# Patient Record
Sex: Female | Born: 1975 | Race: White | Hispanic: No | Marital: Married | Smoking: Former smoker
Health system: Southern US, Community
[De-identification: ages and names within clinical notes are randomized; demographics above are authoritative.]

## PROBLEM LIST (undated history)

## (undated) DIAGNOSIS — K219 Gastro-esophageal reflux disease without esophagitis: Secondary | ICD-10-CM

## (undated) DIAGNOSIS — F419 Anxiety disorder, unspecified: Secondary | ICD-10-CM

## (undated) DIAGNOSIS — F32A Depression, unspecified: Secondary | ICD-10-CM

## (undated) DIAGNOSIS — F329 Major depressive disorder, single episode, unspecified: Secondary | ICD-10-CM

## (undated) HISTORY — DX: Gastro-esophageal reflux disease without esophagitis: K21.9

## (undated) HISTORY — DX: Anxiety disorder, unspecified: F41.9

## (undated) HISTORY — PX: TONSILLECTOMY: SHX5217

## (undated) HISTORY — DX: Depression, unspecified: F32.A

## (undated) HISTORY — DX: Major depressive disorder, single episode, unspecified: F32.9

## (undated) HISTORY — PX: TUBAL LIGATION: SHX77

---

## 2006-05-16 ENCOUNTER — Emergency Department: Payer: Self-pay | Admitting: Emergency Medicine

## 2010-03-26 ENCOUNTER — Emergency Department: Payer: Self-pay | Admitting: Emergency Medicine

## 2011-04-07 ENCOUNTER — Inpatient Hospital Stay: Payer: Self-pay | Admitting: Obstetrics and Gynecology

## 2013-11-11 ENCOUNTER — Emergency Department: Payer: Self-pay | Admitting: Emergency Medicine

## 2013-11-11 LAB — CBC
HCT: 40.7 % (ref 35.0–47.0)
HGB: 14.1 g/dL (ref 12.0–16.0)
MCH: 31.5 pg (ref 26.0–34.0)
MCHC: 34.7 g/dL (ref 32.0–36.0)
MCV: 91 fL (ref 80–100)
Platelet: 251 10*3/uL (ref 150–440)
RBC: 4.48 10*6/uL (ref 3.80–5.20)
RDW: 13.4 % (ref 11.5–14.5)
WBC: 6.4 10*3/uL (ref 3.6–11.0)

## 2013-11-11 LAB — BASIC METABOLIC PANEL
ANION GAP: 4 — AB (ref 7–16)
BUN: 15 mg/dL (ref 7–18)
CHLORIDE: 105 mmol/L (ref 98–107)
Calcium, Total: 9.3 mg/dL (ref 8.5–10.1)
Co2: 27 mmol/L (ref 21–32)
Creatinine: 0.68 mg/dL (ref 0.60–1.30)
EGFR (African American): >60
EGFR (Non-African Amer.): >60
Glucose: 88 mg/dL (ref 65–99)
OSMOLALITY: 272 (ref 275–301)
POTASSIUM: 4.1 mmol/L (ref 3.5–5.1)
Sodium: 136 mmol/L (ref 136–145)

## 2013-11-11 LAB — TROPONIN I: Troponin-I: <0.02 ng/mL

## 2015-05-31 ENCOUNTER — Other Ambulatory Visit: Payer: Self-pay | Admitting: Family Medicine

## 2015-06-01 NOTE — Telephone Encounter (Signed)
Routing to provider  

## 2015-09-15 ENCOUNTER — Other Ambulatory Visit: Payer: Self-pay | Admitting: Family Medicine

## 2015-09-15 NOTE — Telephone Encounter (Signed)
She is doing good with it. No complaints, would like to continue it.

## 2015-09-15 NOTE — Telephone Encounter (Signed)
Your patient.  Thanks 

## 2015-09-15 NOTE — Telephone Encounter (Signed)
Please just call patient, see how she's doing in terms of her mood; offer appt if stress or depression; otherwise, I'll be happy to refill until her next physical (due January 28, 2016 or just after)

## 2015-09-15 NOTE — Telephone Encounter (Signed)
Left message to call.

## 2016-03-07 ENCOUNTER — Other Ambulatory Visit: Payer: Self-pay

## 2016-03-07 MED ORDER — CITALOPRAM HYDROBROMIDE 40 MG PO TABS: 40.0000 mg | ORAL_TABLET | Freq: Every day | ORAL | Status: DC

## 2016-03-07 NOTE — Telephone Encounter (Signed)
Last seen by you for CPE 4/16

## 2016-03-07 NOTE — Telephone Encounter (Signed)
Great, thank you Rx sent with note asking her to schedule appt

## 2016-03-07 NOTE — Telephone Encounter (Signed)
When was patient last seen by me or other provider? I don't see any record of her in the chart, and there is no PCP listed Thank you

## 2016-04-25 ENCOUNTER — Other Ambulatory Visit: Payer: Self-pay

## 2016-04-26 ENCOUNTER — Other Ambulatory Visit: Payer: Self-pay

## 2016-04-26 NOTE — Telephone Encounter (Signed)
Patient has appointment coming up tomorrow.

## 2016-04-27 ENCOUNTER — Encounter: Payer: Self-pay | Admitting: Unknown Physician Specialty

## 2016-04-27 ENCOUNTER — Ambulatory Visit (INDEPENDENT_AMBULATORY_CARE_PROVIDER_SITE_OTHER): Payer: BLUE CROSS/BLUE SHIELD | Admitting: Unknown Physician Specialty

## 2016-04-27 ENCOUNTER — Telehealth: Payer: Self-pay | Admitting: Unknown Physician Specialty

## 2016-04-27 VITALS — BP 130/84 | HR 87 | Temp 99.1°F | Ht 65.3 in | Wt 275.8 lb

## 2016-04-27 DIAGNOSIS — Z23 Encounter for immunization: Secondary | ICD-10-CM

## 2016-04-27 DIAGNOSIS — M25471 Effusion, right ankle: Secondary | ICD-10-CM | POA: Diagnosis not present

## 2016-04-27 DIAGNOSIS — F32A Depression, unspecified: Secondary | ICD-10-CM | POA: Insufficient documentation

## 2016-04-27 DIAGNOSIS — Z0000 Encounter for general adult medical examination without abnormal findings: Secondary | ICD-10-CM | POA: Diagnosis not present

## 2016-04-27 DIAGNOSIS — L259 Unspecified contact dermatitis, unspecified cause: Secondary | ICD-10-CM

## 2016-04-27 DIAGNOSIS — F329 Major depressive disorder, single episode, unspecified: Secondary | ICD-10-CM | POA: Diagnosis not present

## 2016-04-27 DIAGNOSIS — R5383 Other fatigue: Secondary | ICD-10-CM | POA: Diagnosis not present

## 2016-04-27 DIAGNOSIS — E669 Obesity, unspecified: Secondary | ICD-10-CM | POA: Diagnosis not present

## 2016-04-27 MED ORDER — MOMETASONE FUROATE 0.1 % EX CREA: 1.0000 "application " | TOPICAL_CREAM | Freq: Every day | CUTANEOUS | 0 refills | Status: DC

## 2016-04-27 MED ORDER — CITALOPRAM HYDROBROMIDE 40 MG PO TABS: 40.0000 mg | ORAL_TABLET | Freq: Every day | ORAL | 3 refills | Status: DC

## 2016-04-27 MED ORDER — PREDNISONE 10 MG (21) PO TBPK: 10.0000 mg | ORAL_TABLET | Freq: Every day | ORAL | 0 refills | Status: DC

## 2016-04-27 MED ORDER — RANITIDINE HCL 150 MG PO TABS: 150.0000 mg | ORAL_TABLET | Freq: Two times a day (BID) | ORAL | 3 refills | Status: DC

## 2016-04-27 NOTE — Assessment & Plan Note (Signed)
Work on diet and exercise 

## 2016-04-27 NOTE — Telephone Encounter (Signed)
Pharmacy would like clarification on predniSONE (STERAPRED UNI-PAK 21 TAB) 10 MG (21) TBPK tablet.

## 2016-04-27 NOTE — Assessment & Plan Note (Signed)
Check [place] of right leg

## 2016-04-27 NOTE — Progress Notes (Signed)
 BP 130/84 (BP Location: Left Arm, Patient Position: Sitting, Cuff Size: Large)   Pulse 87   Temp 99.1 F (37.3 C)   Ht 5' 5.3" (1.659 m)   Wt 275 lb 12.8 oz (125.1 kg)   LMP 03/12/2016 (Approximate)   SpO2 97%   BMI 45.47 kg/m    Subjective:    Patient ID: Ellen Owens, female    DOB: 08/02/1976, 40 y.o.   MRN: [id number]  HPI: Ellen Owens is a 40 y.o. female  Chief Complaint  Patient presents with  . Annual Exam   Depression Pt has had issues with depression for a while.  She is taking daily Citalopram.  More stress lately as husband placed on the heart transplant list.   Depression screen PHQ 2/9 04/27/2016  Decreased Interest 2  Down, Depressed, Hopeless 3  PHQ - 2 Score 5  Altered sleeping 2  Tired, decreased energy 2  Change in appetite 3  Feeling bad or failure about yourself  0  Trouble concentrating 1  Moving slowly or fidgety/restless 0  Suicidal thoughts 0  PHQ-9 Score 13    Relevant past medical, surgical, family and social history reviewed and updated as indicated. Interim medical history since our last visit reviewed. Allergies and medications reviewed and updated.  Review of Systems  Constitutional: Negative.   HENT: Negative.   Eyes: Negative.   Respiratory: Negative.   Cardiovascular: Negative.   Gastrointestinal: Negative.   Endocrine: Negative.   Genitourinary: Negative.   Musculoskeletal: Negative.        Intermitent right swelling right ankle.  No pain.  Worse when up and around  Skin: Negative.        Got into poison ivy and has it on her face.    Allergic/Immunologic: Negative.   Neurological: Negative.   Hematological: Negative.   Psychiatric/Behavioral: Negative.     Per HPI unless specifically indicated above     Objective:    BP 130/84 (BP Location: Left Arm, Patient Position: Sitting, Cuff Size: Large)   Pulse 87   Temp 99.1 F (37.3 C)   Ht 5' 5.3" (1.659 m)   Wt 275 lb 12.8 oz (125.1 kg)   LMP 03/12/2016  (Approximate)   SpO2 97%   BMI 45.47 kg/m   Wt Readings from Last 3 Encounters:  04/27/16 275 lb 12.8 oz (125.1 kg)  01/27/15 232 lb (105.2 kg)    Physical Exam  Constitutional: She is oriented to person, place, and time. She appears well-developed and well-nourished.  HENT:  Head: Normocephalic and atraumatic.  Eyes: Pupils are equal, round, and reactive to light. Right eye exhibits no discharge. Left eye exhibits no discharge. No scleral icterus.  Neck: Normal range of motion. Neck supple. Carotid bruit is not present. No thyromegaly present.  Cardiovascular: Normal rate, regular rhythm and normal heart sounds.  Exam reveals no gallop and no friction rub.   No murmur heard. Pulmonary/Chest: Effort normal and breath sounds normal. No respiratory distress. She has no wheezes. She has no rales.  Abdominal: Soft. Bowel sounds are normal. There is no tenderness. There is no rebound.  Genitourinary: Vagina normal and uterus normal. No breast swelling, tenderness or discharge. Cervix exhibits no motion tenderness, no discharge and no friability. Right adnexum displays no mass, no tenderness and no fullness. Left adnexum displays no mass, no tenderness and no fullness.  Musculoskeletal: Normal range of motion.  Lymphadenopathy:    She has no cervical adenopathy.  Neurological: She is alert   and oriented to person, place, and time.  Skin: Skin is warm, dry and intact. No rash noted.  Psychiatric: She has a normal mood and affect. Her speech is normal and behavior is normal. Judgment and thought content normal. Cognition and memory are normal.    Results for orders placed or performed in visit on 11/11/13  Troponin I  Result Value Ref Range   Troponin-I < 0.02 ng/mL  CBC  Result Value Ref Range   WBC 6.4 3.6 - 11.0 x10 3/mm 3   RBC 4.48 3.80 - 5.20 X10 6/mm 3   HGB 14.1 12.0 - 16.0 g/dL   HCT 40.7 35.0 - 47.0 %   MCV 91 80 - 100 fL   MCH 31.5 26.0 - 34.0 pg   MCHC 34.7 32.0 - 36.0 g/dL     RDW 13.4 11.5 - 14.5 %   Platelet 251 150 - 440 x10 3/mm 3  Basic metabolic panel  Result Value Ref Range   Glucose 88 65 - 99 mg/dL   BUN 15 7 - 18 mg/dL   Creatinine 0.68 0.60 - 1.30 mg/dL   Sodium 136 136 - 145 mmol/L   Potassium 4.1 3.5 - 5.1 mmol/L   Chloride 105 98 - 107 mmol/L   Co2 27 21 - 32 mmol/L   Calcium, Total 9.3 8.5 - 10.1 mg/dL   Osmolality 272 275 - 301   Anion Gap 4 (L) 7 - 16   EGFR (African American) >60    EGFR (Non-African Amer.) >60       Assessment & Plan:   Problem List Items Addressed This Visit      Unprioritized   Depression    Check Vit D and TSH.  Start Buproprion if labs normal      Relevant Orders   TSH   VITAMIN D 25 Hydroxy (Vit-D Deficiency, Fractures)   Obesity    Work on diet and exercise.        Right ankle swelling    Check [place] of right leg      Relevant Orders   US Venous Img Lower Unilateral Right    Other Visit Diagnoses    Need for diphtheria-tetanus-pertussis (Tdap) vaccine, adult/adolescent    -  Primary   Relevant Orders   Tdap vaccine greater than or equal to 7yo IM (Completed)   Contact dermatitis       Rx for Prednisone   Routine general medical examination at a health care facility       Relevant Orders   CBC with Differential/Platelet   Comprehensive metabolic panel   Lipid Panel w/o Chol/HDL Ratio   IGP, Aptima HPV, rfx 16/18,45   VITAMIN D 25 Hydroxy (Vit-D Deficiency, Fractures)   Other fatigue           Follow up plan: Return in about 4 weeks (around 05/25/2016).

## 2016-04-27 NOTE — Telephone Encounter (Signed)
Pt needs refills on Citalopram 40mg  and Ranitidine 150mg .  Pt states that she forgot to ask for the refills during her visit.

## 2016-04-27 NOTE — Telephone Encounter (Signed)
Talked to pharmacy that I wanted a dose pack

## 2016-04-27 NOTE — Assessment & Plan Note (Signed)
Check Vit D and TSH.  Start Buproprion if labs normal

## 2016-04-27 NOTE — Telephone Encounter (Signed)
Routing to provider for clarification. The rx was written for #21 and take 1 tablet daily. Is this correct or is there different directions?

## 2016-04-27 NOTE — Patient Instructions (Signed)
Tdap Vaccine (Tetanus, Diphtheria and Pertussis): What You Need to Know 1. Why get vaccinated? Tetanus, diphtheria and pertussis are very serious diseases. Tdap vaccine can protect us from these diseases. And, Tdap vaccine given to pregnant women can protect newborn babies against pertussis. TETANUS (Lockjaw) is rare in the [place] today. It causes painful muscle tightening and stiffness, usually all over the body.  It can lead to tightening of muscles in the head and neck so you can't open your mouth, swallow, or sometimes even breathe. Tetanus kills about 1 out of 10 people who are infected even after receiving the best medical care. DIPHTHERIA is also rare in the [place] today. It can cause a thick coating to form in the back of the throat.  It can lead to breathing problems, heart failure, paralysis, and death. PERTUSSIS (Whooping Cough) causes severe coughing spells, which can cause difficulty breathing, vomiting and disturbed sleep.  It can also lead to weight loss, incontinence, and rib fractures. Up to 2 in 100 adolescents and 5 in 100 adults with pertussis are hospitalized or have complications, which could include pneumonia or death. These diseases are caused by bacteria. Diphtheria and pertussis are spread from person to person through secretions from coughing or sneezing. Tetanus enters the body through cuts, scratches, or wounds. Before vaccines, as many as 200,000 cases of diphtheria, 200,000 cases of pertussis, and hundreds of cases of tetanus, were reported in the [place] each year. Since vaccination began, reports of cases for tetanus and diphtheria have dropped by about 99% and for pertussis by about 80%. 2. Tdap vaccine Tdap vaccine can protect adolescents and adults from tetanus, diphtheria, and pertussis. One dose of Tdap is routinely given at age 11 or 12. People who did not get Tdap at that age should get it as soon as possible. Tdap is especially important  for healthcare professionals and anyone having close contact with a baby younger than 12 months. Pregnant women should get a dose of Tdap during every pregnancy, to protect the newborn from pertussis. Infants are most at risk for severe, life-threatening complications from pertussis. Another vaccine, called Td, protects against tetanus and diphtheria, but not pertussis. A Td booster should be given every 10 years. Tdap may be given as one of these boosters if you have never gotten Tdap before. Tdap may also be given after a severe cut or burn to prevent tetanus infection. Your doctor or the person giving you the vaccine can give you more information. Tdap may safely be given at the same time as other vaccines. 3. Some people should not get this vaccine  A person who has ever had a life-threatening allergic reaction after a previous dose of any diphtheria, tetanus or pertussis containing vaccine, OR has a severe allergy to any part of this vaccine, should not get Tdap vaccine. Tell the person giving the vaccine about any severe allergies.  Anyone who had coma or long repeated seizures within 7 days after a childhood dose of DTP or DTaP, or a previous dose of Tdap, should not get Tdap, unless a cause other than the vaccine was found. They can still get Td.  Talk to your doctor if you:  have seizures or another nervous system problem,  had severe pain or swelling after any vaccine containing diphtheria, tetanus or pertussis,  ever had a condition called Guillain-Barr Syndrome (GBS),  aren't feeling well on the day the shot is scheduled. 4. Risks With any medicine, including vaccines, there is   a chance of side effects. These are usually mild and go away on their own. Serious reactions are also possible but are rare. Most people who get Tdap vaccine do not have any problems with it. Mild problems following Tdap (Did not interfere with activities)  Pain where the shot was given (about 3 in 4  adolescents or 2 in 3 adults)  Redness or swelling where the shot was given (about 1 person in 5)  Mild fever of at least 100.4F (up to about 1 in 25 adolescents or 1 in 100 adults)  Headache (about 3 or 4 people in 10)  Tiredness (about 1 person in 3 or 4)  Nausea, vomiting, diarrhea, stomach ache (up to 1 in 4 adolescents or 1 in 10 adults)  Chills, sore joints (about 1 person in 10)  Body aches (about 1 person in 3 or 4)  Rash, swollen glands (uncommon) Moderate problems following Tdap (Interfered with activities, but did not require medical attention)  Pain where the shot was given (up to 1 in 5 or 6)  Redness or swelling where the shot was given (up to about 1 in 16 adolescents or 1 in 12 adults)  Fever over 102F (about 1 in 100 adolescents or 1 in 250 adults)  Headache (about 1 in 7 adolescents or 1 in 10 adults)  Nausea, vomiting, diarrhea, stomach ache (up to 1 or 3 people in 100)  Swelling of the entire arm where the shot was given (up to about 1 in 500). Severe problems following Tdap (Unable to perform usual activities; required medical attention)  Swelling, severe pain, bleeding and redness in the arm where the shot was given (rare). Problems that could happen after any vaccine:  People sometimes faint after a medical procedure, including vaccination. Sitting or lying down for about 15 minutes can help prevent fainting, and injuries caused by a fall. Tell your doctor if you feel dizzy, or have vision changes or ringing in the ears.  Some people get severe pain in the shoulder and have difficulty moving the arm where a shot was given. This happens very rarely.  Any medication can cause a severe allergic reaction. Such reactions from a vaccine are very rare, estimated at fewer than 1 in a million doses, and would happen within a few minutes to a few hours after the vaccination. As with any medicine, there is a very remote chance of a vaccine causing a serious  injury or death. The safety of vaccines is always being monitored. For more information, visit: www.cdc.gov/vaccinesafety/ 5. What if there is a serious problem? What should I look for?  Look for anything that concerns you, such as signs of a severe allergic reaction, very high fever, or unusual behavior.  Signs of a severe allergic reaction can include hives, swelling of the face and throat, difficulty breathing, a fast heartbeat, dizziness, and weakness. These would usually start a few minutes to a few hours after the vaccination. What should I do?  If you think it is a severe allergic reaction or other emergency that can't wait, call 9-1-1 or get the person to the nearest hospital. Otherwise, call your doctor.  Afterward, the reaction should be reported to the Vaccine Adverse Event Reporting System (VAERS). Your doctor might file this report, or you can do it yourself through the VAERS web site at www.vaers.hhs.gov, or by calling 1-800-822-7967. VAERS does not give medical advice.  6. The National Vaccine Injury Compensation Program The National Vaccine Injury Compensation Program (  VICP) is a federal program that was created to compensate people who may have been injured by certain vaccines. Persons who believe they may have been injured by a vaccine can learn about the program and about filing a claim by calling 1-800-338-2382 or visiting the VICP website at www.hrsa.gov/vaccinecompensation. There is a time limit to file a claim for compensation. 7. How can I learn more?  Ask your doctor. He or she can give you the vaccine package insert or suggest other sources of information.  Call your local or state health department.  Contact the Centers for Disease Control and Prevention (CDC):  Call 1-800-232-4636 (1-800-CDC-INFO) or  Visit CDC's website at www.cdc.gov/vaccines CDC Tdap Vaccine VIS (11/26/13)   This information is not intended to replace advice given to you by your health care  provider. Make sure you discuss any questions you have with your health care provider.   Document Released: 03/20/2012 Document Revised: 10/10/2014 Document Reviewed: 01/01/2014 Elsevier Interactive Patient Education 2016 Elsevier Inc.  

## 2016-04-28 LAB — CBC WITH DIFFERENTIAL/PLATELET
Basophils Absolute: 0 10*3/uL (ref 0.0–0.2)
Basos: 0 %
EOS (ABSOLUTE): 0.3 10*3/uL (ref 0.0–0.4)
EOS: 4 %
HEMATOCRIT: 41.4 % (ref 34.0–46.6)
HEMOGLOBIN: 13.3 g/dL (ref 11.1–15.9)
IMMATURE GRANS (ABS): 0 10*3/uL (ref 0.0–0.1)
IMMATURE GRANULOCYTES: 1 %
LYMPHS: 29 %
Lymphocytes Absolute: 2 10*3/uL (ref 0.7–3.1)
MCH: 28.1 pg (ref 26.6–33.0)
MCHC: 32.1 g/dL (ref 31.5–35.7)
MCV: 87 fL (ref 79–97)
Monocytes Absolute: 0.8 10*3/uL (ref 0.1–0.9)
Monocytes: 12 %
NEUTROS PCT: 54 %
Neutrophils Absolute: 3.7 10*3/uL (ref 1.4–7.0)
Platelets: 356 10*3/uL (ref 150–379)
RBC: 4.74 x10E6/uL (ref 3.77–5.28)
RDW: 14.3 % (ref 12.3–15.4)
WBC: 6.8 10*3/uL (ref 3.4–10.8)

## 2016-04-28 LAB — LIPID PANEL W/O CHOL/HDL RATIO
Cholesterol, Total: 187 mg/dL (ref 100–199)
HDL: 54 mg/dL (ref >39)
LDL CALC: 106 mg/dL — AB (ref 0–99)
TRIGLYCERIDES: 135 mg/dL (ref 0–149)
VLDL Cholesterol Cal: 27 mg/dL (ref 5–40)

## 2016-04-28 LAB — TSH: TSH: 1.02 u[IU]/mL (ref 0.450–4.500)

## 2016-04-28 LAB — COMPREHENSIVE METABOLIC PANEL
ALBUMIN: 4.4 g/dL (ref 3.5–5.5)
ALT: 14 IU/L (ref 0–32)
AST: 14 IU/L (ref 0–40)
Albumin/Globulin Ratio: 1.5 (ref 1.2–2.2)
Alkaline Phosphatase: 78 IU/L (ref 39–117)
BUN/Creatinine Ratio: 8 — ABNORMAL LOW (ref 9–23)
BUN: 6 mg/dL (ref 6–20)
Bilirubin Total: 0.4 mg/dL (ref 0.0–1.2)
CALCIUM: 9.3 mg/dL (ref 8.7–10.2)
CO2: 23 mmol/L (ref 18–29)
CREATININE: 0.72 mg/dL (ref 0.57–1.00)
Chloride: 98 mmol/L (ref 96–106)
GFR calc Af Amer: 123 mL/min/{1.73_m2} (ref >59)
GFR, EST NON AFRICAN AMERICAN: 107 mL/min/{1.73_m2} (ref >59)
GLOBULIN, TOTAL: 3 g/dL (ref 1.5–4.5)
Glucose: 92 mg/dL (ref 65–99)
Potassium: 4.8 mmol/L (ref 3.5–5.2)
SODIUM: 140 mmol/L (ref 134–144)
TOTAL PROTEIN: 7.4 g/dL (ref 6.0–8.5)

## 2016-04-28 LAB — VITAMIN D 25 HYDROXY (VIT D DEFICIENCY, FRACTURES): VIT D 25 HYDROXY: 30.7 ng/mL (ref 30.0–100.0)

## 2016-04-29 ENCOUNTER — Encounter: Payer: Self-pay | Admitting: Unknown Physician Specialty

## 2016-04-29 NOTE — Progress Notes (Signed)
Normal labs.  Patient notified by letter.

## 2016-05-03 LAB — IGP, APTIMA HPV, RFX 16/18,45
HPV Aptima: NEGATIVE
PAP SMEAR COMMENT: 0

## 2016-05-11 ENCOUNTER — Ambulatory Visit
Admission: RE | Admit: 2016-05-11 | Discharge: 2016-05-11 | Disposition: A | Discharge status: Home / Self Care | Payer: BLUE CROSS/BLUE SHIELD | Source: Ambulatory Visit | Attending: Unknown Physician Specialty | Admitting: Unknown Physician Specialty

## 2016-05-11 DIAGNOSIS — M25471 Effusion, right ankle: Secondary | ICD-10-CM | POA: Insufficient documentation

## 2016-05-12 ENCOUNTER — Telehealth: Payer: Self-pay | Admitting: Family Medicine

## 2016-05-12 NOTE — Telephone Encounter (Signed)
Called and left patient a voicemail asking for her to please return my call.  

## 2016-05-12 NOTE — Telephone Encounter (Signed)
Please let her know that she does not have a DVT and that Elnita MaxwellCheryl will further discuss her results with her over the next few days. Thanks!

## 2016-05-12 NOTE — Telephone Encounter (Signed)
Patient returned call and was notified of what Dr. Laural BenesJohnson said.

## 2016-05-25 ENCOUNTER — Encounter: Payer: Self-pay | Admitting: Unknown Physician Specialty

## 2016-05-25 ENCOUNTER — Ambulatory Visit (INDEPENDENT_AMBULATORY_CARE_PROVIDER_SITE_OTHER): Payer: BLUE CROSS/BLUE SHIELD | Admitting: Unknown Physician Specialty

## 2016-05-25 VITALS — BP 123/79 | HR 73 | Temp 97.8°F | Wt 276.2 lb

## 2016-05-25 DIAGNOSIS — F329 Major depressive disorder, single episode, unspecified: Secondary | ICD-10-CM | POA: Diagnosis not present

## 2016-05-25 DIAGNOSIS — E669 Obesity, unspecified: Secondary | ICD-10-CM

## 2016-05-25 DIAGNOSIS — F32A Depression, unspecified: Secondary | ICD-10-CM

## 2016-05-25 NOTE — Assessment & Plan Note (Signed)
This is improving without additional medication.  Continue with social support and add exercise

## 2016-05-25 NOTE — Progress Notes (Signed)
BP 123/79 (BP Location: Left Arm, Patient Position: Sitting, Cuff Size: Large)   Pulse 73   Temp 97.8 F (36.6 C)   Wt 276 lb 3.2 oz (125.3 kg)   LMP 05/09/2016 (Approximate)   SpO2 96%   BMI 45.54 kg/m    Subjective:    Patient ID: Ellen ShoalsChristina M Owens, female    DOB: 05/23/1976, 40 y.o.   MRN: [id number]  HPI: Ellen Owens is a 40 y.o. female  Chief Complaint  Patient presents with  . Depression   Depression States it is going "a little bit better."  She still finds herself easily overwhelmed.  She is getting some social support with another mom.    [facility]2016 04/27/2016  Decreased Interest 1 2  Down, Depressed, Hopeless 1 3  PHQ - 2 Score 2 5  Altered sleeping - 2  Tired, decreased energy - 2  Change in appetite - 3  Feeling bad or failure about yourself  - 0  Trouble concentrating - 1  Moving slowly or fidgety/restless - 0  Suicidal thoughts - 0  PHQ-9 Score - 13    Relevant past medical, surgical, family and social history reviewed and updated as indicated. Interim medical history since our last visit reviewed. Allergies and medications reviewed and updated.  Review of Systems  Per HPI unless specifically indicated above     Objective:    BP 123/79 (BP Location: Left Arm, Patient Position: Sitting, Cuff Size: Large)   Pulse 73   Temp 97.8 F (36.6 C)   Wt 276 lb 3.2 oz (125.3 kg)   LMP 05/09/2016 (Approximate)   SpO2 96%   BMI 45.54 kg/m   Wt Readings from Last 3 Encounters:  05/25/16 276 lb 3.2 oz (125.3 kg)  04/27/16 275 lb 12.8 oz (125.1 kg)  01/27/15 232 lb (105.2 kg)    Physical Exam  Constitutional: She is oriented to person, place, and time. She appears well-developed and well-nourished. No distress.  HENT:  Head: Normocephalic and atraumatic.  Eyes: Conjunctivae and lids are normal. Right eye exhibits no discharge. Left eye exhibits no discharge. No scleral icterus.  Cardiovascular: Normal rate.     Pulmonary/Chest: Effort normal.  Abdominal: Normal appearance. There is no splenomegaly or hepatomegaly.  Musculoskeletal: Normal range of motion.  Neurological: She is alert and oriented to person, place, and time.  Skin: Skin is intact. No rash noted. No pallor.  Psychiatric: She has a normal mood and affect. Her behavior is normal. Judgment and thought content normal.    Results for orders placed or performed in visit on 04/27/16  CBC with Differential/Platelet  Result Value Ref Range   WBC 6.8 3.4 - 10.8 x10E3/uL   RBC 4.74 3.77 - 5.28 x10E6/uL   Hemoglobin 13.3 11.1 - 15.9 g/dL   Hematocrit 13.241.4 44.034.0 - 46.6 %   MCV 87 79 - 97 fL   MCH 28.1 26.6 - 33.0 pg   MCHC 32.1 31.5 - 35.7 g/dL   RDW 10.214.3 72.512.3 - 36.615.4 %   Platelets 356 150 - 379 x10E3/uL   Neutrophils 54 %   Lymphs 29 %   Monocytes 12 %   Eos 4 %   Basos 0 %   Neutrophils Absolute 3.7 1.4 - 7.0 x10E3/uL   Lymphocytes Absolute 2.0 0.7 - 3.1 x10E3/uL   Monocytes Absolute 0.8 0.1 - 0.9 x10E3/uL   EOS (ABSOLUTE) 0.3 0.0 - 0.4 x10E3/uL   Basophils Absolute 0.0 0.0 - 0.2 x10E3/uL  Immature Granulocytes 1 %   Immature Grans (Abs) 0.0 0.0 - 0.1 x10E3/uL  Comprehensive metabolic panel  Result Value Ref Range   Glucose 92 65 - 99 mg/dL   BUN 6 6 - 20 mg/dL   Creatinine, Ser 1.610.72 0.57 - 1.00 mg/dL   GFR calc non Af Amer 107 >59 mL/min/1.73   GFR calc Af Amer 123 >59 mL/min/1.73   BUN/Creatinine Ratio 8 (L) 9 - 23   Sodium 140 134 - 144 mmol/L   Potassium 4.8 3.5 - 5.2 mmol/L   Chloride 98 96 - 106 mmol/L   CO2 23 18 - 29 mmol/L   Calcium 9.3 8.7 - 10.2 mg/dL   Total Protein 7.4 6.0 - 8.5 g/dL   Albumin 4.4 3.5 - 5.5 g/dL   Globulin, Total 3.0 1.5 - 4.5 g/dL   Albumin/Globulin Ratio 1.5 1.2 - 2.2   Bilirubin Total 0.4 0.0 - 1.2 mg/dL   Alkaline Phosphatase 78 39 - 117 IU/L   AST 14 0 - 40 IU/L   ALT 14 0 - 32 IU/L  Lipid Panel w/o Chol/HDL Ratio  Result Value Ref Range   Cholesterol, Total 187 100 - 199 mg/dL    Triglycerides 096135 0 - 149 mg/dL   HDL 54 >04>39 mg/dL   VLDL Cholesterol Cal 27 5 - 40 mg/dL   LDL Calculated 540106 (H) 0 - 99 mg/dL  TSH  Result Value Ref Range   TSH 1.020 0.450 - 4.500 uIU/mL  VITAMIN D 25 Hydroxy (Vit-D Deficiency, Fractures)  Result Value Ref Range   Vit D, 25-Hydroxy 30.7 30.0 - 100.0 ng/mL  IGP, Aptima HPV, rfx 16/18,45  Result Value Ref Range   DIAGNOSIS: Comment    Specimen adequacy: Comment    CLINICIAN PROVIDED ICD10: Comment    Performed by: Comment    PAP SMEAR COMMENT .    Note: Comment    Test Methodology Comment    HPV Aptima Negative Negative      Assessment & Plan:   Problem List Items Addressed This Visit      Unprioritized   Depression    This is improving without additional medication.  Continue with social support and add exercise      Obesity - Primary    Discussed diet and exercise       Other Visit Diagnoses   None.      Follow up plan: Return if symptoms worsen or fail to improve.

## 2016-05-25 NOTE — Assessment & Plan Note (Signed)
Discussed diet and exercise 

## 2017-04-20 ENCOUNTER — Other Ambulatory Visit: Payer: Self-pay | Admitting: Unknown Physician Specialty

## 2017-04-28 ENCOUNTER — Encounter: Payer: BLUE CROSS/BLUE SHIELD | Admitting: Unknown Physician Specialty

## 2017-05-15 IMAGING — US US EXTREM LOW VENOUS*R*
1 series · 13 of 24 positions shown · non-contrast
Comparison: None.

CLINICAL DATA: Right lower extremity pain and swelling for the past
2 weeks. History spider veins. Evaluate for DVT.



[Series 1: us extrem low venous*right* · 0.08mm/px · 13 of 35 slices shown]
[im 1/35]
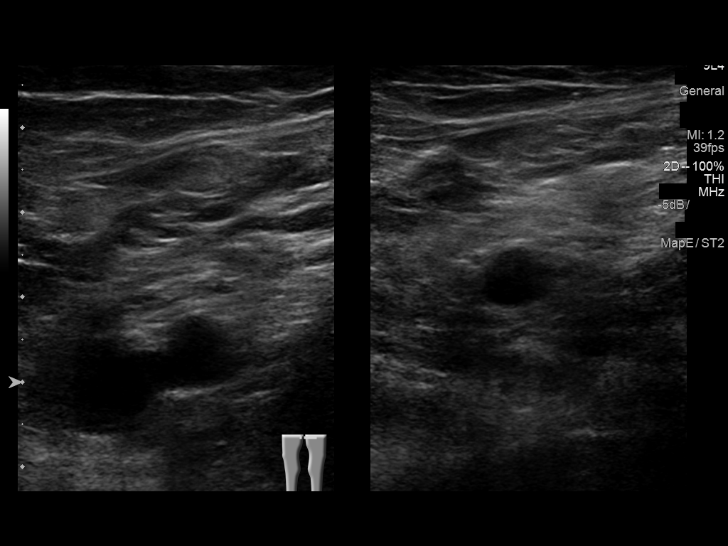
[im 3/35]
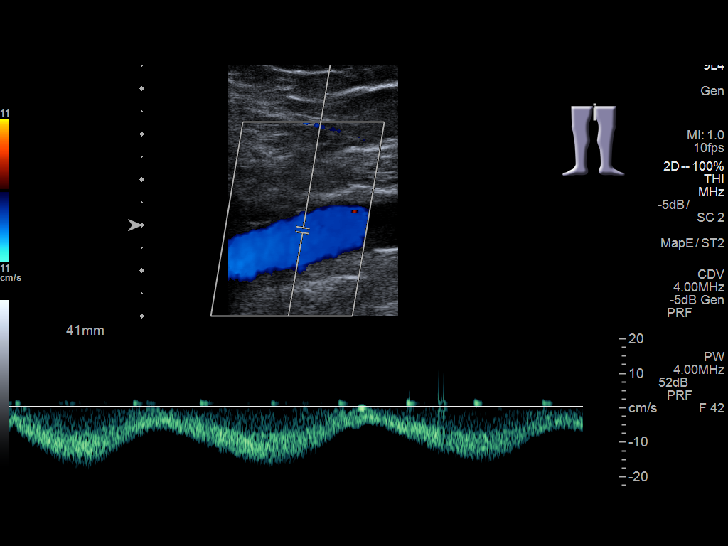
[im 6/35]
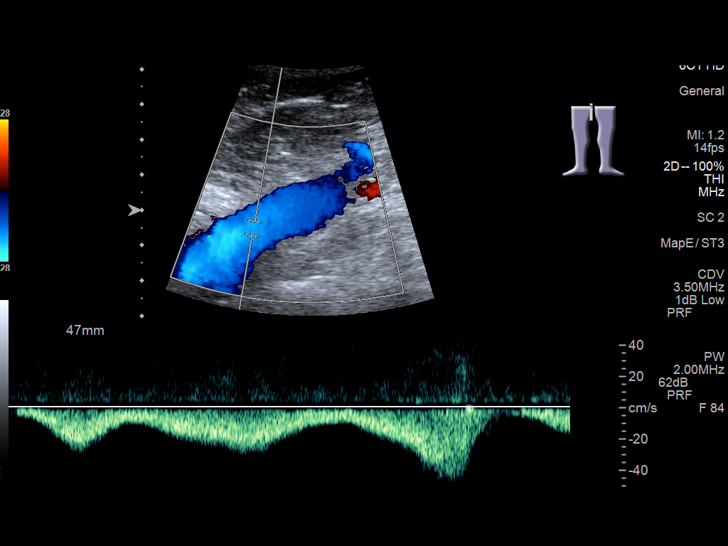
[im 9/35]
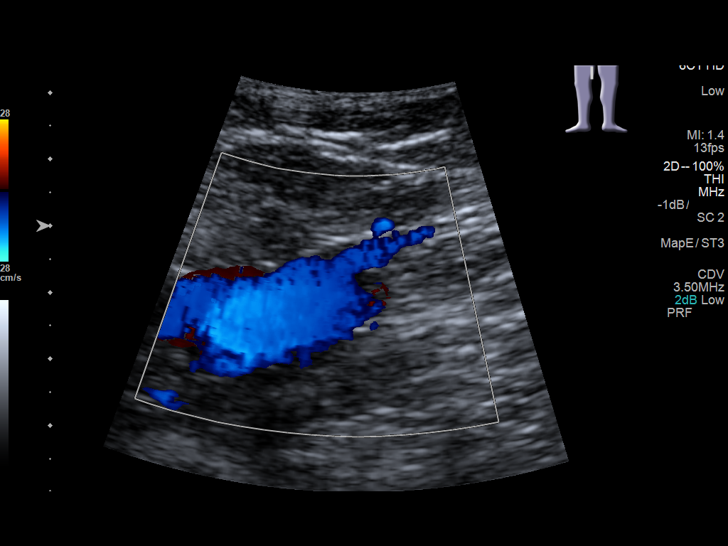
[im 12/35]
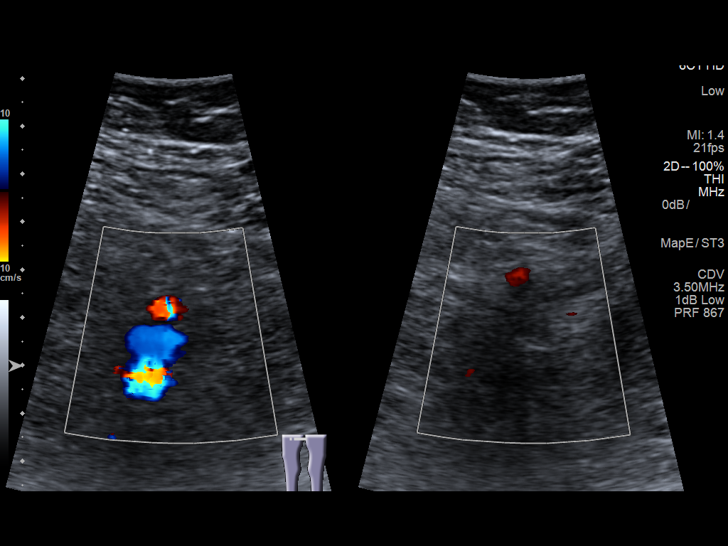
[im 15/35]
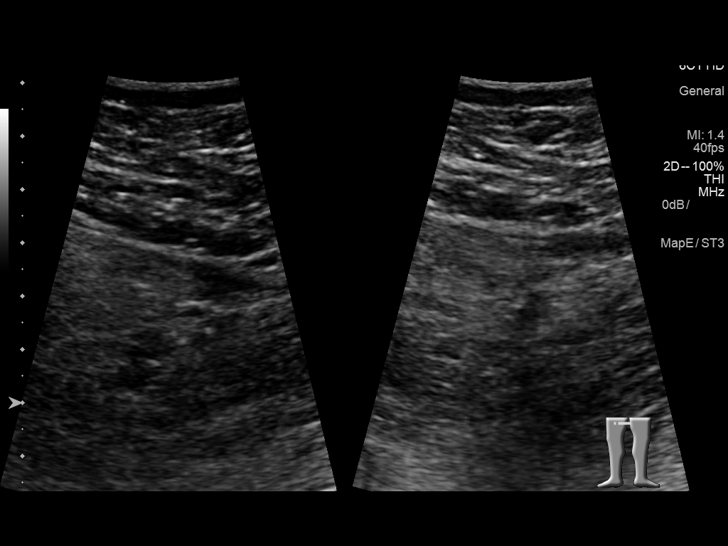
[im 18/35]
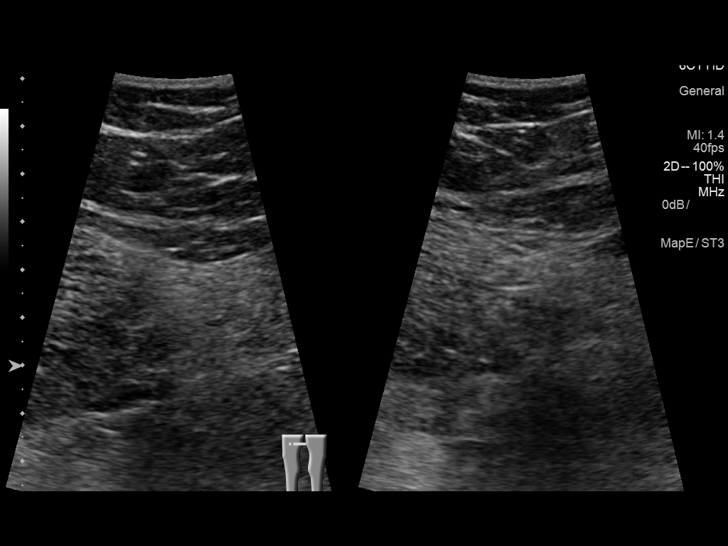
[im 20/35]
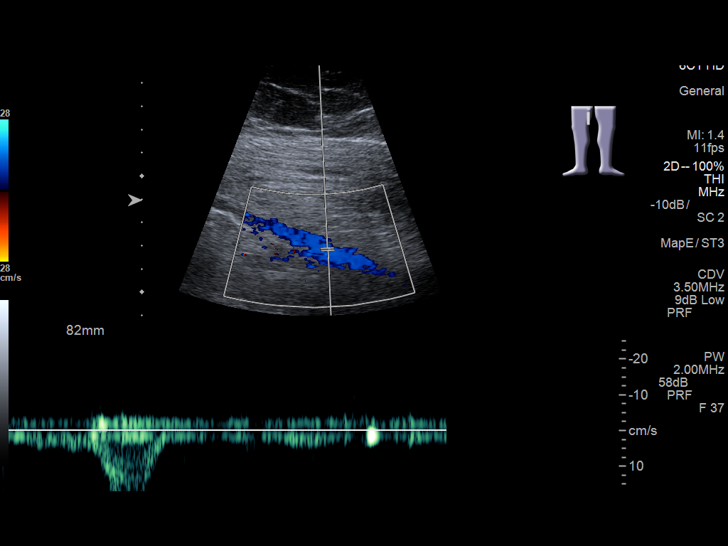
[im 23/35]
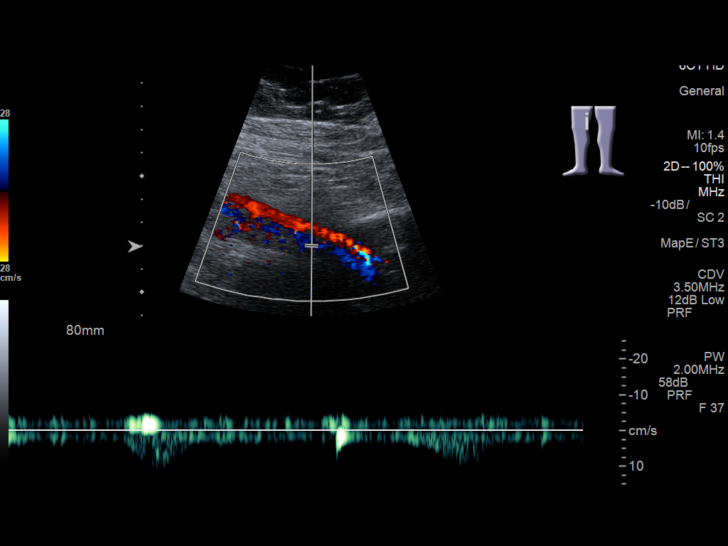
[im 26/35]
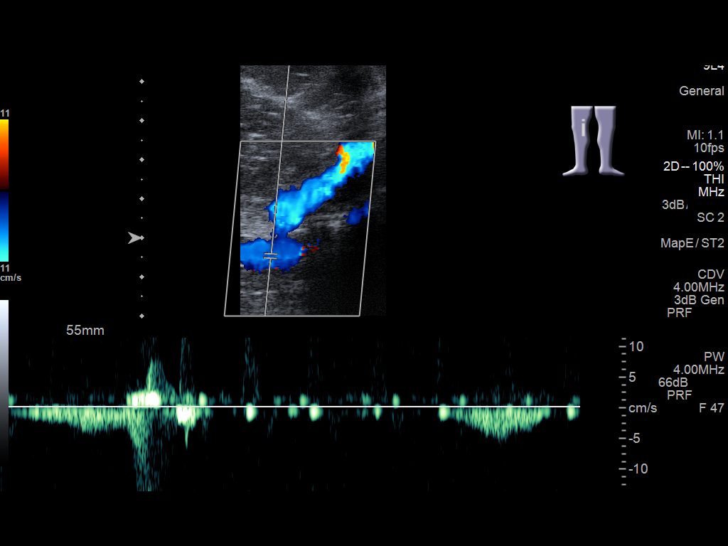
[im 29/35]
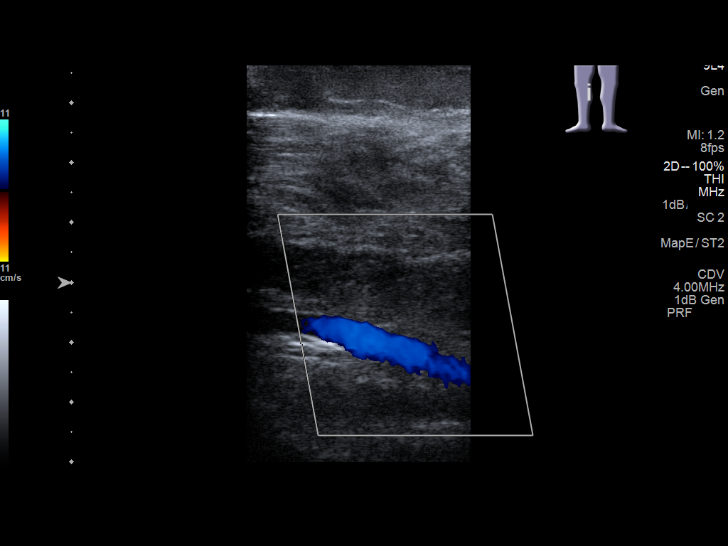
[im 32/35]
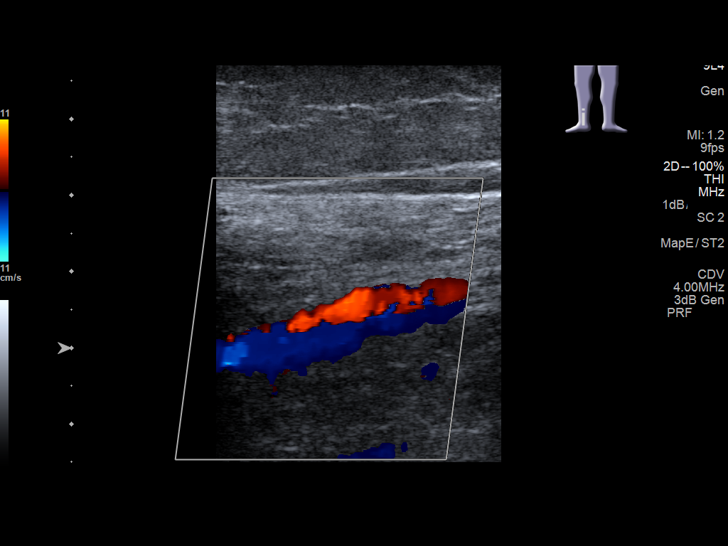
[im 35/35]
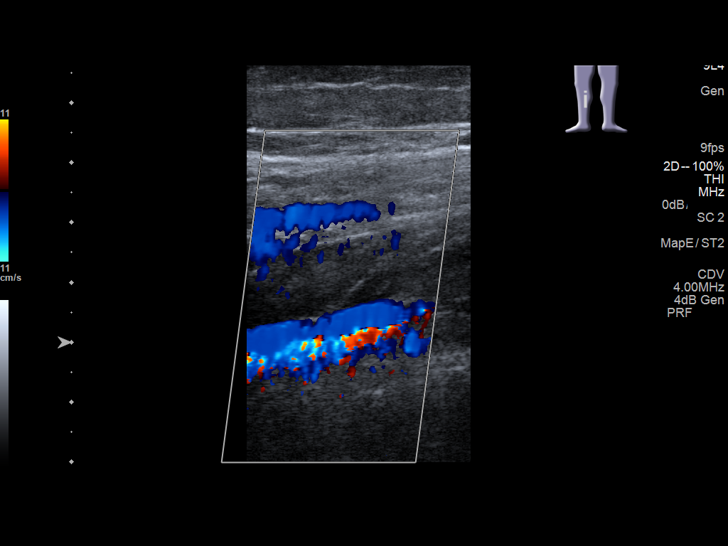

[13 of 24 positions shown; findings below may reference images not displayed]

FINDINGS: Contralateral Common Femoral Vein: Respiratory phasicity is normal
and symmetric with the symptomatic side. No evidence of thrombus.
Normal compressibility.

Common Femoral Vein: No evidence of thrombus. Normal
compressibility, respiratory phasicity and response to augmentation.

Saphenofemoral Junction: No evidence of thrombus. Normal
compressibility and flow on color Doppler imaging.

Profunda Femoral Vein: No evidence of thrombus. Normal
compressibility and flow on color Doppler imaging.

Femoral Vein: No evidence of thrombus. Normal compressibility,
respiratory phasicity and response to augmentation.

Popliteal Vein: No evidence of thrombus. Normal compressibility,
respiratory phasicity and response to augmentation.

Calf Veins: No evidence of thrombus. Normal compressibility and flow
on color Doppler imaging.

Superficial Great Saphenous Vein: No evidence of thrombus. Normal
compressibility and flow on color Doppler imaging.

Venous Reflux:  None.

Other Findings:  None.
IMPRESSION: No evidence of DVT within the right lower extremity.

## 2024-01-12 ENCOUNTER — Other Ambulatory Visit: Payer: Self-pay | Admitting: Internal Medicine

## 2024-01-12 DIAGNOSIS — Z1231 Encounter for screening mammogram for malignant neoplasm of breast: Secondary | ICD-10-CM

## 2024-02-01 DIAGNOSIS — Z800 Family history of malignant neoplasm of digestive organs: Secondary | ICD-10-CM

## 2024-02-01 DIAGNOSIS — Z1371 Encounter for nonprocreative screening for genetic disease carrier status: Secondary | ICD-10-CM

## 2024-02-01 HISTORY — DX: Encounter for nonprocreative screening for genetic disease carrier status: Z13.71

## 2024-02-01 HISTORY — DX: Family history of malignant neoplasm of digestive organs: Z80.0

## 2024-02-14 NOTE — Progress Notes (Unsigned)
 PCP: Cindee Crazier, NP (Inactive)   No chief complaint on file.   HPI:      Ms. Ellen Owens is a 48 y.o. No obstetric history on file. whose LMP was No LMP recorded., presents today for her NP annual examination.  Her menses are {norm/abn:715}, lasting {number: 22536} days.  Dysmenorrhea {dysmen:716}. She {does:18564} have intermenstrual bleeding. She {does:18564} have vasomotor sx.   Sex activity: {sex active: 315163}. She {does:18564} have vaginal dryness.  Last Pap: 04/27/16 Results were: no abnormalities /neg HPV DNA.  Hx of STDs: {STD hx:14358}  Last mammogram: {date:304500300}  Results were: {norm/abn:13465} There is no FH of breast cancer. There is no FH of ovarian cancer. The patient {does:18564} do self-breast exams.  Colonoscopy: {hx:15363}  Repeat due after 10*** years.   Tobacco use: {tob:20664} Alcohol use: {Alcohol:11675} No drug use Exercise: {exercise:31265}  She {does:18564} get adequate calcium and Vitamin D  in her diet.  Labs with PCP.   Patient Active Problem List   Diagnosis Date Noted   Right ankle swelling 04/27/2016   Depression 04/27/2016   Obesity 04/27/2016    Past Surgical History:  Procedure Laterality Date   TONSILLECTOMY     TUBAL LIGATION      Family History  Problem Relation Age of Onset   Hypertension Mother    Thyroid disease Mother    Pancreatic cancer Mother    Heart Problems Father        had a heart valve replaced   Cancer Maternal Grandmother        skin   Cancer Maternal Grandfather        skin   Emphysema Paternal Grandmother    Emphysema Paternal Grandfather     Social History   Socioeconomic History   Marital status: Married    Spouse name: Not on file   Number of children: Not on file   Years of education: Not on file   Highest education level: Not on file  Occupational History   Not on file  Tobacco Use   Smoking status: Former   Smokeless tobacco: Never  Substance and Sexual Activity    Alcohol use: Yes    Comment: occasional   Drug use: No   Sexual activity: Yes  Other Topics Concern   Not on file  Social History Narrative   Not on file   Social Drivers of Health   Financial Resource Strain: Low Risk  (01/11/2024)   Received from [facility]   Overall Financial Resource Strain (CARDIA)    Difficulty of Paying Living Expenses: Not hard at all  Food Insecurity: No Food Insecurity (01/11/2024)   Received from [facility]   Hunger Vital Sign    Worried About Running Out of Food in the Last Year: Never true    Ran Out of Food in the Last Year: Never true  Transportation Needs: No Transportation Needs (01/11/2024)   Received [facility]    In the past 12 months, has lack of transportation kept you from medical appointments or from getting medications?: No    Lack of Transportation (Non-Medical): No  Physical Activity: Not on file  Stress: Not on file  Social Connections: Not on file  Intimate Partner Violence: Not on file     Current Outpatient Medications:    citalopram  (CELEXA ) 40 MG tablet, Take 1 tablet (40 mg total) by mouth daily., Disp: 90 tablet, Rfl: 3   ranitidine  (  ZANTAC ) 150 MG tablet, Take 1 tablet (150 mg total) by mouth 2 (two) times daily., Disp: 90 tablet, Rfl: 3     ROS:  Review of Systems BREAST: No symptoms    Objective: There were no vitals taken for this visit.   OBGyn Exam  Results: No results found for this or any previous visit (from the past 24 hours).  Assessment/Plan:  No diagnosis found.   No orders of the defined types were placed in this encounter.           GYN counsel {counseling: 16159}    F/U  No follow-ups on file.  Kwabena Strutz B. Eames Dibiasio, PA-C 02/14/2024 4:42 PM

## 2024-02-15 ENCOUNTER — Other Ambulatory Visit (HOSPITAL_COMMUNITY)
Admission: RE | Admit: 2024-02-15 | Discharge: 2024-02-15 | Disposition: A | Discharge status: Home / Self Care | Source: Ambulatory Visit | Attending: Obstetrics and Gynecology | Admitting: Obstetrics and Gynecology

## 2024-02-15 ENCOUNTER — Ambulatory Visit (INDEPENDENT_AMBULATORY_CARE_PROVIDER_SITE_OTHER): Admitting: Obstetrics and Gynecology

## 2024-02-15 ENCOUNTER — Encounter: Payer: Self-pay | Admitting: Obstetrics and Gynecology

## 2024-02-15 VITALS — BP 106/68 | HR 71 | Ht 65.0 in | Wt 143.0 lb

## 2024-02-15 DIAGNOSIS — Z124 Encounter for screening for malignant neoplasm of cervix: Secondary | ICD-10-CM

## 2024-02-15 DIAGNOSIS — Z1151 Encounter for screening for human papillomavirus (HPV): Secondary | ICD-10-CM | POA: Insufficient documentation

## 2024-02-15 DIAGNOSIS — Z1231 Encounter for screening mammogram for malignant neoplasm of breast: Secondary | ICD-10-CM

## 2024-02-15 DIAGNOSIS — Z800 Family history of malignant neoplasm of digestive organs: Secondary | ICD-10-CM | POA: Insufficient documentation

## 2024-02-15 DIAGNOSIS — R6882 Decreased libido: Secondary | ICD-10-CM

## 2024-02-15 DIAGNOSIS — N951 Menopausal and female climacteric states: Secondary | ICD-10-CM

## 2024-02-15 DIAGNOSIS — Z1211 Encounter for screening for malignant neoplasm of colon: Secondary | ICD-10-CM

## 2024-02-15 DIAGNOSIS — N898 Other specified noninflammatory disorders of vagina: Secondary | ICD-10-CM

## 2024-02-15 DIAGNOSIS — Z01419 Encounter for gynecological examination (general) (routine) without abnormal findings: Secondary | ICD-10-CM

## 2024-02-15 MED ORDER — AMBULATORY NON FORMULARY MEDICATION: 2 refills | Status: AC

## 2024-02-15 NOTE — Patient Instructions (Addendum)
 I value your feedback and you entrusting [place] with your care. If you get a [facility] patient survey, I would appreciate you taking the time to let us know about your experience today. Thank you!  [facility] ([place])--[phone]

## 2024-02-16 ENCOUNTER — Telehealth: Payer: Self-pay

## 2024-02-16 NOTE — Telephone Encounter (Signed)
 Pt called stated pharmacy had questions on Testosterone cream. I called pharmacy they stated that cream comes in a pump vile and reccommended that the suggested does be 1ml in the pump vile. I agreed and would let [facility] know.

## 2024-02-19 NOTE — Telephone Encounter (Signed)
 1 ml pump is perfect

## 2024-02-21 LAB — CYTOLOGY - PAP
Comment: NEGATIVE
Diagnosis: NEGATIVE
Diagnosis: REACTIVE
High risk HPV: NEGATIVE

## 2024-03-05 ENCOUNTER — Encounter: Payer: Self-pay | Admitting: Obstetrics and Gynecology

## 2024-04-01 ENCOUNTER — Telehealth: Payer: Self-pay | Admitting: Obstetrics and Gynecology

## 2024-04-01 NOTE — Telephone Encounter (Signed)
 Pt aware of neg MyRisk results. IBIS=4.9%/6.6%.   Patient understands these results only apply to her and her children, and this is not indicative of genetic testing results of her other family members. It is recommended that her other family members have genetic testing done.  Pt also understands negative genetic testing doesn't mean she will never get any of these cancers.   Hard copy mailed to pt. F/u prn.
